# Patient Record
Sex: Female | Born: 1990 | Race: Black or African American | Hispanic: No | Marital: Single
Health system: Southern US, Community
[De-identification: ages and names within clinical notes are randomized; demographics above are authoritative.]

---

## 2012-06-09 ENCOUNTER — Other Ambulatory Visit (HOSPITAL_COMMUNITY): Payer: Self-pay | Admitting: Obstetrics and Gynecology

## 2012-06-09 DIAGNOSIS — IMO0002 Reserved for concepts with insufficient information to code with codable children: Secondary | ICD-10-CM

## 2012-06-09 DIAGNOSIS — O4290 Premature rupture of membranes, unspecified as to length of time between rupture and onset of labor, unspecified weeks of gestation: Secondary | ICD-10-CM

## 2012-06-10 ENCOUNTER — Ambulatory Visit (HOSPITAL_COMMUNITY)
Admission: RE | Admit: 2012-06-10 | Discharge: 2012-06-10 | Disposition: A | Discharge status: Home / Self Care | Payer: Medicaid Other | Source: Ambulatory Visit | Attending: Obstetrics and Gynecology | Admitting: Obstetrics and Gynecology

## 2012-06-10 ENCOUNTER — Other Ambulatory Visit (HOSPITAL_COMMUNITY): Payer: Self-pay | Admitting: Obstetrics and Gynecology

## 2012-06-10 ENCOUNTER — Encounter (HOSPITAL_COMMUNITY): Payer: Self-pay

## 2012-06-10 VITALS — BP 96/62 | HR 70 | Wt 119.0 lb

## 2012-06-10 DIAGNOSIS — O358XX0 Maternal care for other (suspected) fetal abnormality and damage, not applicable or unspecified: Secondary | ICD-10-CM | POA: Insufficient documentation

## 2012-06-10 DIAGNOSIS — O0930 Supervision of pregnancy with insufficient antenatal care, unspecified trimester: Secondary | ICD-10-CM | POA: Insufficient documentation

## 2012-06-10 DIAGNOSIS — O4100X0 Oligohydramnios, unspecified trimester, not applicable or unspecified: Secondary | ICD-10-CM | POA: Insufficient documentation

## 2012-06-10 DIAGNOSIS — Z363 Encounter for antenatal screening for malformations: Secondary | ICD-10-CM | POA: Insufficient documentation

## 2012-06-10 DIAGNOSIS — IMO0002 Reserved for concepts with insufficient information to code with codable children: Secondary | ICD-10-CM

## 2012-06-10 DIAGNOSIS — Z1389 Encounter for screening for other disorder: Secondary | ICD-10-CM | POA: Insufficient documentation

## 2012-06-10 NOTE — Progress Notes (Signed)
Patient seen today  for ultrasound.  See full report in [facility].  Alpha Gula, MD  Patient is referred due to oligohydramnios on outside ultrasound.  She is a 21 yo G1P0 currently at 22 1/7 weeks by previous ultrasound.  She reports that she had initially planned a termination of pregnancy in April.  She had Laminaria placed and subsequently changed her mind.  She reports leakage of fluid since the time that the laminaria were removed some some light vaginal bleeding.  She denies fevers/ chills but reports some cramping frequently.    Limited anatomy noted due to oligo/ anhydramnios.  Kidneys, bladder and stomach bubble were clearly seeen.  The etiology of oligohydramnios is most likely PPROM given the patient's history.  Single IUP at 22 1/7 weeks by previous ultrasound Anhydramnios Limited views of the fetal anatomy noted; however stomach, bladder and kidneys clearly seen History and ultrasound findings consistent with PPROM. TVUS (sterile glove) - cervical length of 2.8 cm without funneling or dynamic changes  Recommend sterile speculum exam/ evaluation for ruptured membranes.   Given the patient's history and symptoms, even if sterile speculum exam is negative would be very suspicious for PROM - would manage emperically.  May consider dye studies if work up is completely negative. Patient was encouraged to purchase a thermometer - will check temperatures at least 2x daily. To come to hosptial for fevers >101, abdominal pain, increased bleeding or cramping  Had brief discussion about fetal viability - will follow up next week (at [redacted] weeks gestation) - plan admission at that time, NICU consult, broad specturm latency antibioticis.  Based on counseling with NICU, would offer steroids at 23 weeks if patient desires intervention at that time.

## 2012-06-10 NOTE — ED Notes (Signed)
Pt denies any fever, chills or cramping.  States she has been  "bleeding for a while"

## 2012-06-18 ENCOUNTER — Ambulatory Visit (HOSPITAL_COMMUNITY): Payer: Medicaid Other

## 2013-06-05 IMAGING — US US OB TRANSVAGINAL
1 series · 16 of 28 positions shown · non-contrast
Comparison: none

[Series 1: us ob transvaginal · 16 of 63 slices shown]
[im 1/63]
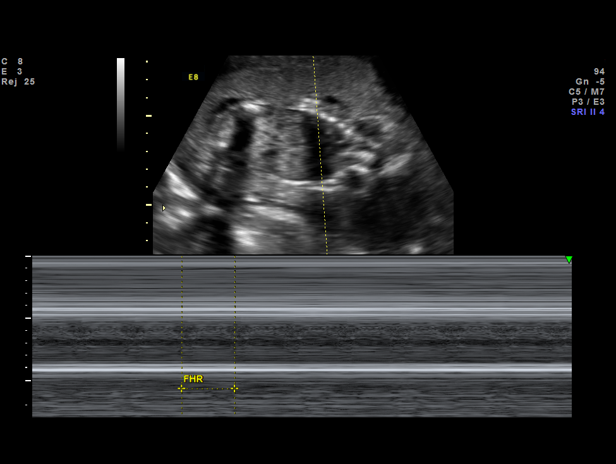
[im 5/63]
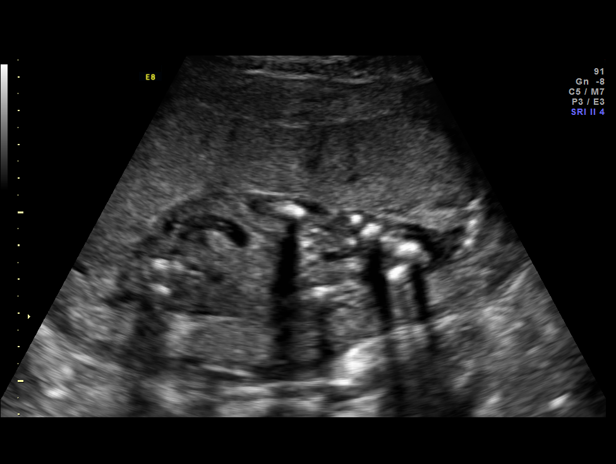
[im 10/63]
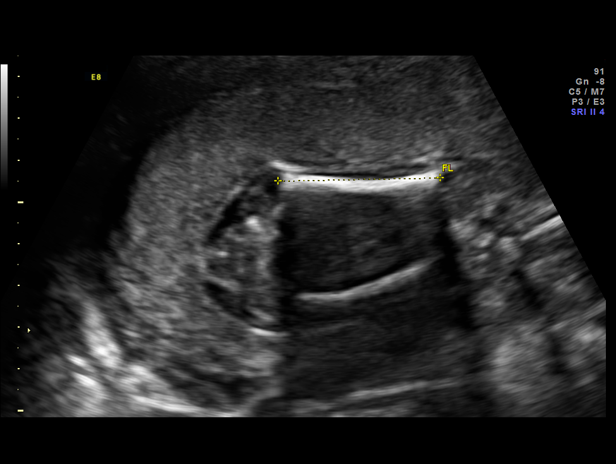
[im 14/63]
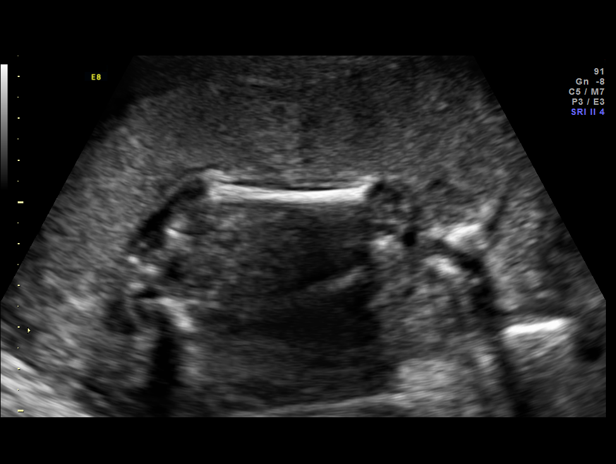
[im 17/63]
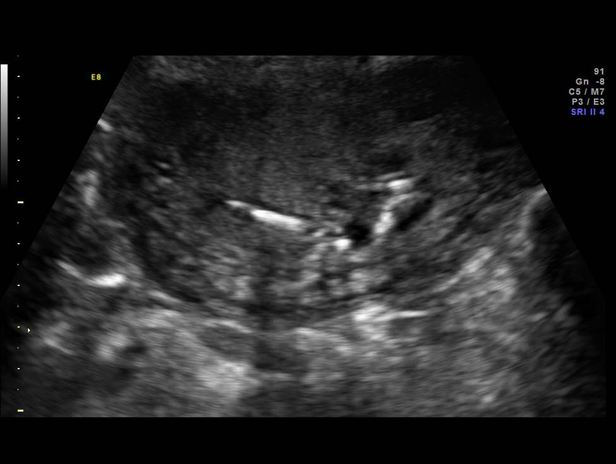
[im 21/63]
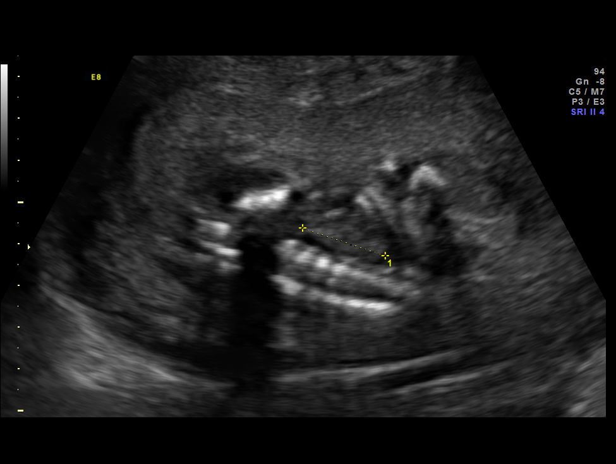
[im 26/63]
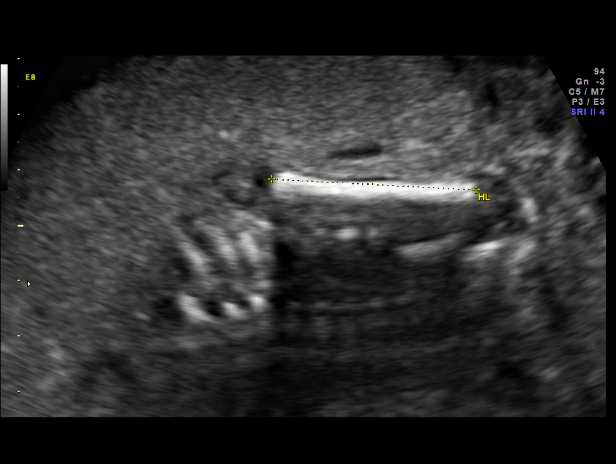
[im 30/63]
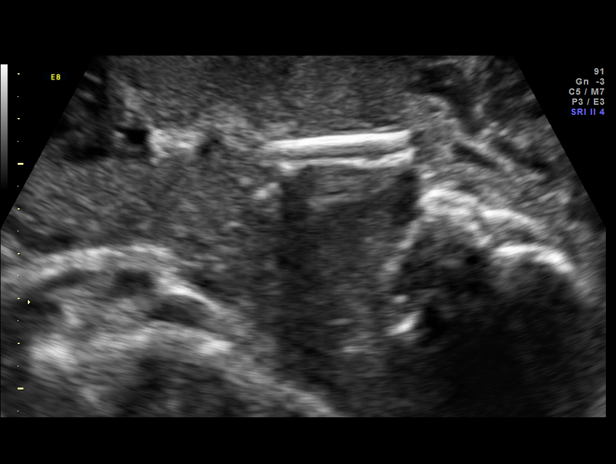
[im 33/63]
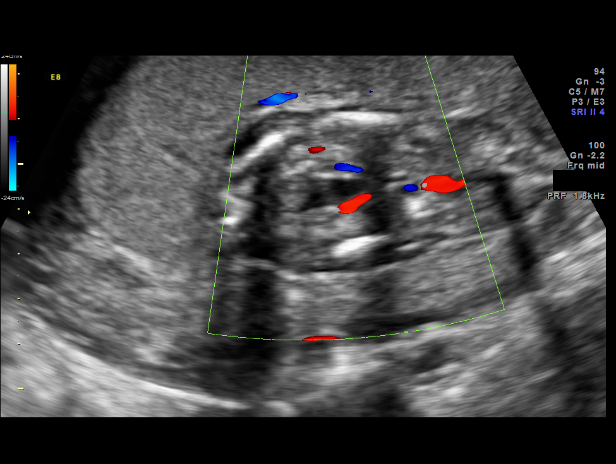
[im 37/63]
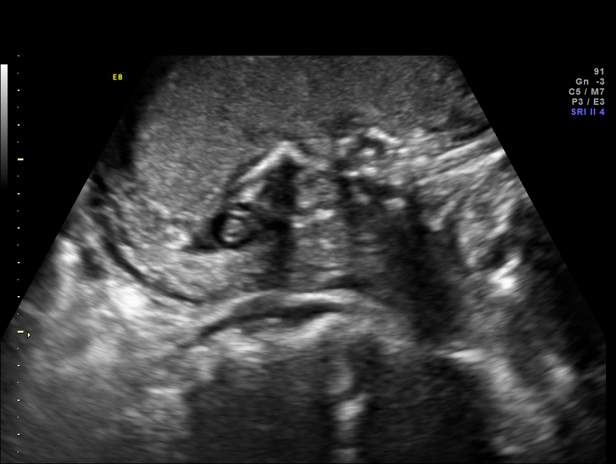
[im 42/63]
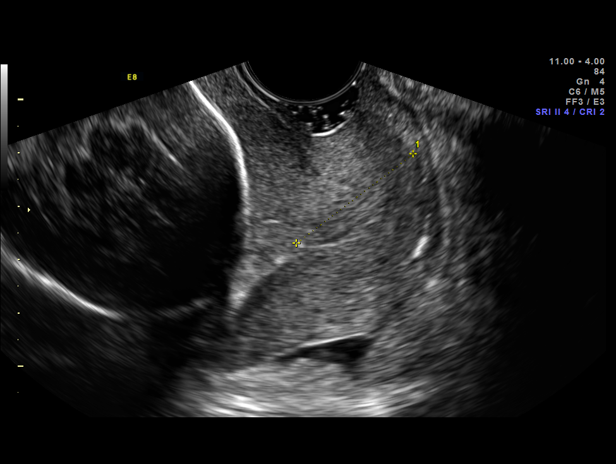
[im 46/63]
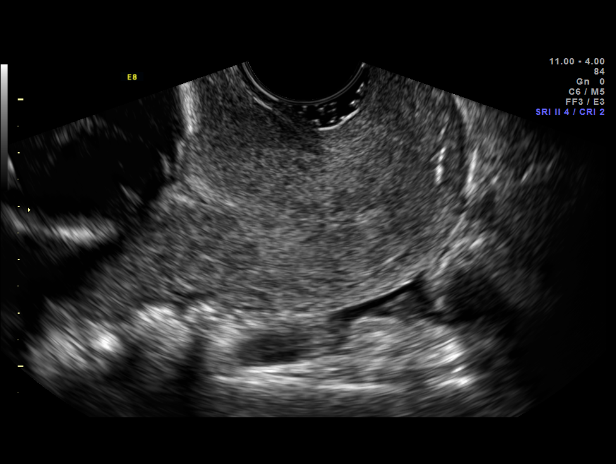
[im 49/63]
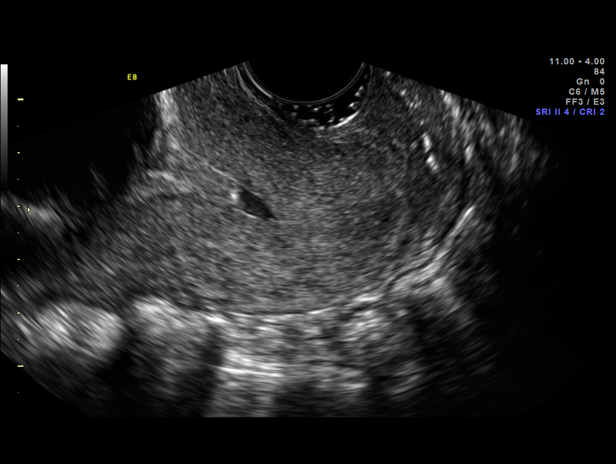
[im 53/63]
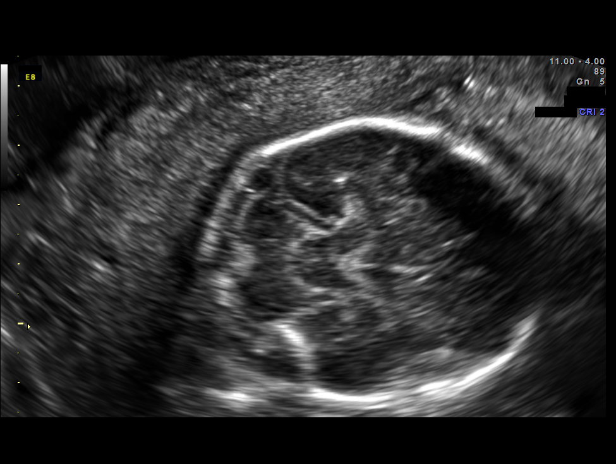
[im 58/63]
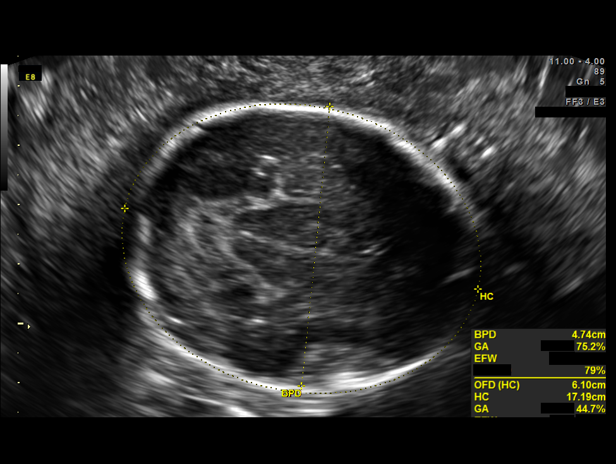
[im 63/63]
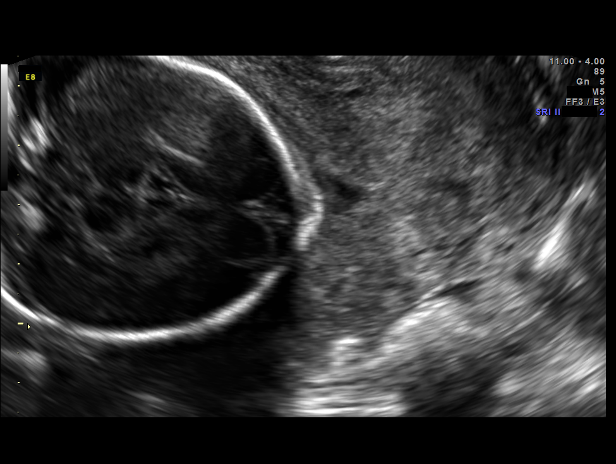

[16 of 28 positions shown; findings below may reference images not displayed]

OBSTETRICS REPORT
                      (Signed Final 06/10/2012 [DATE])

 Order#:         [id number]
                 23_O
Procedures

 US OB DETAIL + 14 WK                                  76811.0
 US OB TRANSVAGINAL                                    76817.0
Indications

 Detailed fetal anatomic survey
 Oligohydramnios / Decreased amniotic fluid volume
 No or Little Prenatal Care
 Vaginal bleeding, unknown etiology
Fetal Evaluation

 Fetal Heart Rate:  161                          bpm
 Cardiac Activity:  Observed
 Presentation:      Cephalic
 Placenta:          Anterior, above cervical os

 Amniotic Fluid
 AFI FV:      Anhydramnios
Biometry

 BPD:     47.6  mm     G. Age:  20w 3d                CI:         77.9   70 - 86
 OFD:     61.1  mm                                    FL/HC:      21.9   18.4 -

 HC:     173.9  mm     G. Age:  19w 6d      < 3  %    HC/AC:      1.08   1.06 -

 AC:     160.5  mm     G. Age:  21w 1d       16  %    FL/BPD:     80.0   71 - 87
 FL:      38.1  mm     G. Age:  22w 2d       42  %    FL/AC:      23.7   20 - 24
 HUM:     36.6  mm     G. Age:  22w 5d       63  %

 Est. FW:     422  gm    0 lb 15 oz      27  %
Gestational Age

 LMP:           19w 5d        Date:  01/24/12                 EDD:   10/30/12
 U/S Today:     21w 0d                                        EDD:   10/21/12
 Best:          22w 1d     Det. By:  Previous Ultrasound      EDD:   10/13/12
Anatomy
 Cranium:           Appears normal      Aortic Arch:       Not well
                                                           visualized
 Fetal Cavum:       Appears normal      Ductal Arch:       Not well
                                                           visualized
 Ventricles:        Appears normal      Diaphragm:         Not well
                                                           visualized
 Choroid Plexus:    Appears normal      Stomach:           Appears
                                                           normal, left
                                                           sided
 Cerebellum:        Appears normal      Abdomen:           Appears normal
 Posterior Fossa:   Appears normal      Abdominal Wall:    Not well
                                                           visualized
 Nuchal Fold:       Not applicable      Cord Vessels:      Appears normal
                    (>20 wks GA)                           (3 vessel cord)
 Face:              Not well            Kidneys:           Appear normal
                    visualized
 Heart:             Not well            Bladder:           Appears normal
                    visualized
 RVOT:              Not well            Spine:             Not well
                    visualized                             visualized
 LVOT:              Not well            Limbs:             Four extremities
                    visualized                             seen
Cervix Uterus Adnexa

 Cervical Length:    2.8      cm

 Cervix:       Normal appearance by transvaginal scan
 Left Ovary:    Not visualized. No adnexal mass visualized.
 Right Ovary:   Not visualized. No adnexal mass visualized.
Comments

 Patient is referred due to oligohydramnios on outside
 ultrasound.  She is a 21 yo G1P0 currently at 22 [DATE] weeks
 by previous ultrasound.  She reports that she had initially
 planned a termination of pregnancy in [place].  She had
 Laminaria placed and subsequently changed her mind.  She
 reports leakage of fluid since the time that the laminaria were
 removed some some light vaginal bleeding.  She denies
 fevers/ chills but reports some cramping frequently.

 Limited anatomy noted due to oligo/ anhydramnios.  Kidneys,
 bladder and stomach bubble were clearly seeen.  The
 etiology of oligohydramnios is most likely PPROM given the
 patient's history.
Impression

 Single IUP at 22 [DATE] weeks by previous ultrasound
 Anhydramnios
 Limited views of the fetal anatomy noted; however stomach,
 bladder and kidneys clearly seen
 History and ultrasound findings consistent with PPROM.
 TVUS (sterile glove) - cervical length of 2.8 cm without
 funneling or dynamic changes
Recommendations

 Recommend sterile speculum exam/ evaluation for ruptured
 membranes.
 Given the patient's history and symptoms, even if sterile
 speculum exam is negative would be very suspicious for
 PROM - would manage emperically.  May consider dye
 studies if work up is completely negative.
 Patient was encouraged to purchase a thermometer - will
 check temperatures at least 2x daily. To come to hosptial for
 fevers >101, abdominal pain, increased bleeding or cramping

 Had brief discussion about fetal viability - will follow up next
 week (at 23 weeks gestation) - plan admission at that time,
 NICU consult, broad specturm latency antibioticis.  Based on
 counseling with NICU, would offer steroids at 23 weeks if
 patient desires intervention at that time.

## 2014-10-10 ENCOUNTER — Encounter (HOSPITAL_COMMUNITY): Payer: Self-pay

## 2022-04-14 ENCOUNTER — Emergency Department (HOSPITAL_COMMUNITY)
Admission: EM | Admit: 2022-04-14 | Discharge: 2022-04-14 | Disposition: A | Discharge status: Home / Self Care | Payer: Medicaid Other | Attending: Emergency Medicine | Admitting: Emergency Medicine

## 2022-04-14 ENCOUNTER — Encounter (HOSPITAL_COMMUNITY): Payer: Self-pay

## 2022-04-14 ENCOUNTER — Emergency Department (HOSPITAL_COMMUNITY): Payer: Medicaid Other

## 2022-04-14 ENCOUNTER — Other Ambulatory Visit: Payer: Self-pay

## 2022-04-14 DIAGNOSIS — S29001A Unspecified injury of muscle and tendon of front wall of thorax, initial encounter: Secondary | ICD-10-CM | POA: Diagnosis present

## 2022-04-14 DIAGNOSIS — E8721 Acute metabolic acidosis: Secondary | ICD-10-CM | POA: Diagnosis not present

## 2022-04-14 DIAGNOSIS — S20212A Contusion of left front wall of thorax, initial encounter: Secondary | ICD-10-CM | POA: Insufficient documentation

## 2022-04-14 DIAGNOSIS — E872 Acidosis, unspecified: Secondary | ICD-10-CM

## 2022-04-14 DIAGNOSIS — E162 Hypoglycemia, unspecified: Secondary | ICD-10-CM | POA: Diagnosis not present

## 2022-04-14 DIAGNOSIS — E876 Hypokalemia: Secondary | ICD-10-CM | POA: Diagnosis not present

## 2022-04-14 DIAGNOSIS — D72829 Elevated white blood cell count, unspecified: Secondary | ICD-10-CM | POA: Diagnosis not present

## 2022-04-14 LAB — CBC WITH DIFFERENTIAL/PLATELET
Abs Immature Granulocytes: 0.57 10*3/uL — ABNORMAL HIGH (ref 0.00–0.07)
Basophils Absolute: 0.1 10*3/uL (ref 0.0–0.1)
Basophils Relative: 1 %
Eosinophils Absolute: 0 10*3/uL (ref 0.0–0.5)
Eosinophils Relative: 0 %
HCT: 46.4 % — ABNORMAL HIGH (ref 36.0–46.0)
Hemoglobin: 15 g/dL (ref 12.0–15.0)
Immature Granulocytes: 3 %
Lymphocytes Relative: 12 %
Lymphs Abs: 2.5 10*3/uL (ref 0.7–4.0)
MCH: 33.1 pg (ref 26.0–34.0)
MCHC: 32.3 g/dL (ref 30.0–36.0)
MCV: 102.4 fL — ABNORMAL HIGH (ref 80.0–100.0)
Monocytes Absolute: 1.2 10*3/uL — ABNORMAL HIGH (ref 0.1–1.0)
Monocytes Relative: 6 %
Neutro Abs: 16.7 10*3/uL — ABNORMAL HIGH (ref 1.7–7.7)
Neutrophils Relative %: 78 %
Platelets: 388 10*3/uL (ref 150–400)
RBC: 4.53 MIL/uL (ref 3.87–5.11)
RDW: 13.7 % (ref 11.5–15.5)
WBC: 21.1 10*3/uL — ABNORMAL HIGH (ref 4.0–10.5)
nRBC: 0 % (ref 0.0–0.2)

## 2022-04-14 LAB — BASIC METABOLIC PANEL
Anion gap: 10 (ref 5–15)
BUN: 10 mg/dL (ref 6–20)
CO2: 19 mmol/L — ABNORMAL LOW (ref 22–32)
Calcium: 8 mg/dL — ABNORMAL LOW (ref 8.9–10.3)
Chloride: 108 mmol/L (ref 98–111)
Creatinine, Ser: 0.44 mg/dL (ref 0.44–1.00)
GFR, Estimated: >60 mL/min (ref >60)
Glucose, Bld: 149 mg/dL — ABNORMAL HIGH (ref 70–99)
Potassium: 3.6 mmol/L (ref 3.5–5.1)
Sodium: 137 mmol/L (ref 135–145)

## 2022-04-14 LAB — COMPREHENSIVE METABOLIC PANEL
ALT: 15 U/L (ref 0–44)
AST: 25 U/L (ref 15–41)
Albumin: 4.8 g/dL (ref 3.5–5.0)
Alkaline Phosphatase: 76 U/L (ref 38–126)
Anion gap: 19 — ABNORMAL HIGH (ref 5–15)
BUN: 12 mg/dL (ref 6–20)
CO2: 15 mmol/L — ABNORMAL LOW (ref 22–32)
Calcium: 8.8 mg/dL — ABNORMAL LOW (ref 8.9–10.3)
Chloride: 109 mmol/L (ref 98–111)
Creatinine, Ser: 0.64 mg/dL (ref 0.44–1.00)
GFR, Estimated: >60 mL/min (ref >60)
Glucose, Bld: 63 mg/dL — ABNORMAL LOW (ref 70–99)
Potassium: 3.1 mmol/L — ABNORMAL LOW (ref 3.5–5.1)
Sodium: 143 mmol/L (ref 135–145)
Total Bilirubin: 0.4 mg/dL (ref 0.3–1.2)
Total Protein: 8.4 g/dL — ABNORMAL HIGH (ref 6.5–8.1)

## 2022-04-14 LAB — I-STAT BETA HCG BLOOD, ED (MC, WL, AP ONLY): I-stat hCG, quantitative: <5 m[IU]/mL (ref <5)

## 2022-04-14 LAB — CBG MONITORING, ED: Glucose-Capillary: 108 mg/dL — ABNORMAL HIGH (ref 70–99)

## 2022-04-14 MED ORDER — KETOROLAC TROMETHAMINE 15 MG/ML IJ SOLN
7.5000 mg | Freq: Once | INTRAMUSCULAR | Status: AC
Start: 2022-04-14 — End: 2022-04-14
  Administered 2022-04-14: 7.5 mg via INTRAVENOUS
  Filled 2022-04-14: qty 1

## 2022-04-14 MED ORDER — SODIUM CHLORIDE 0.9 % IV BOLUS
1000.0000 mL | Freq: Once | INTRAVENOUS | Status: AC
  Administered 2022-04-14: 1000 mL via INTRAVENOUS

## 2022-04-14 MED ORDER — LIDOCAINE 5 % EX PTCH
1.0000 | MEDICATED_PATCH | CUTANEOUS | Status: DC
  Administered 2022-04-14: 1 via TRANSDERMAL
  Filled 2022-04-14: qty 1

## 2022-04-14 MED ORDER — POTASSIUM CHLORIDE CRYS ER 20 MEQ PO TBCR
40.0000 meq | EXTENDED_RELEASE_TABLET | Freq: Once | ORAL | Status: AC
Start: 2022-04-14 — End: 2022-04-14
  Administered 2022-04-14: 40 meq via ORAL

## 2022-04-14 MED ORDER — HALOPERIDOL LACTATE 5 MG/ML IJ SOLN
2.0000 mg | Freq: Once | INTRAMUSCULAR | Status: AC
  Administered 2022-04-14: 2 mg via INTRAVENOUS
  Filled 2022-04-14: qty 1
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
## 2022-04-14 NOTE — Discharge Instructions (Signed)
Take Motrin and Tylenol as needed as directed for rib pain. ?Follow up with your doctor.  ?
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
## 2023-04-09 IMAGING — CR DG RIBS W/ CHEST 3+V*L*
3 series · 3 of 3 positions shown · non-contrast
Comparison: None Available.

CLINICAL DATA: Left ribcage trauma.

EXAM:
LEFT RIBS AND CHEST - 3+ VIEW

[w chest pa]
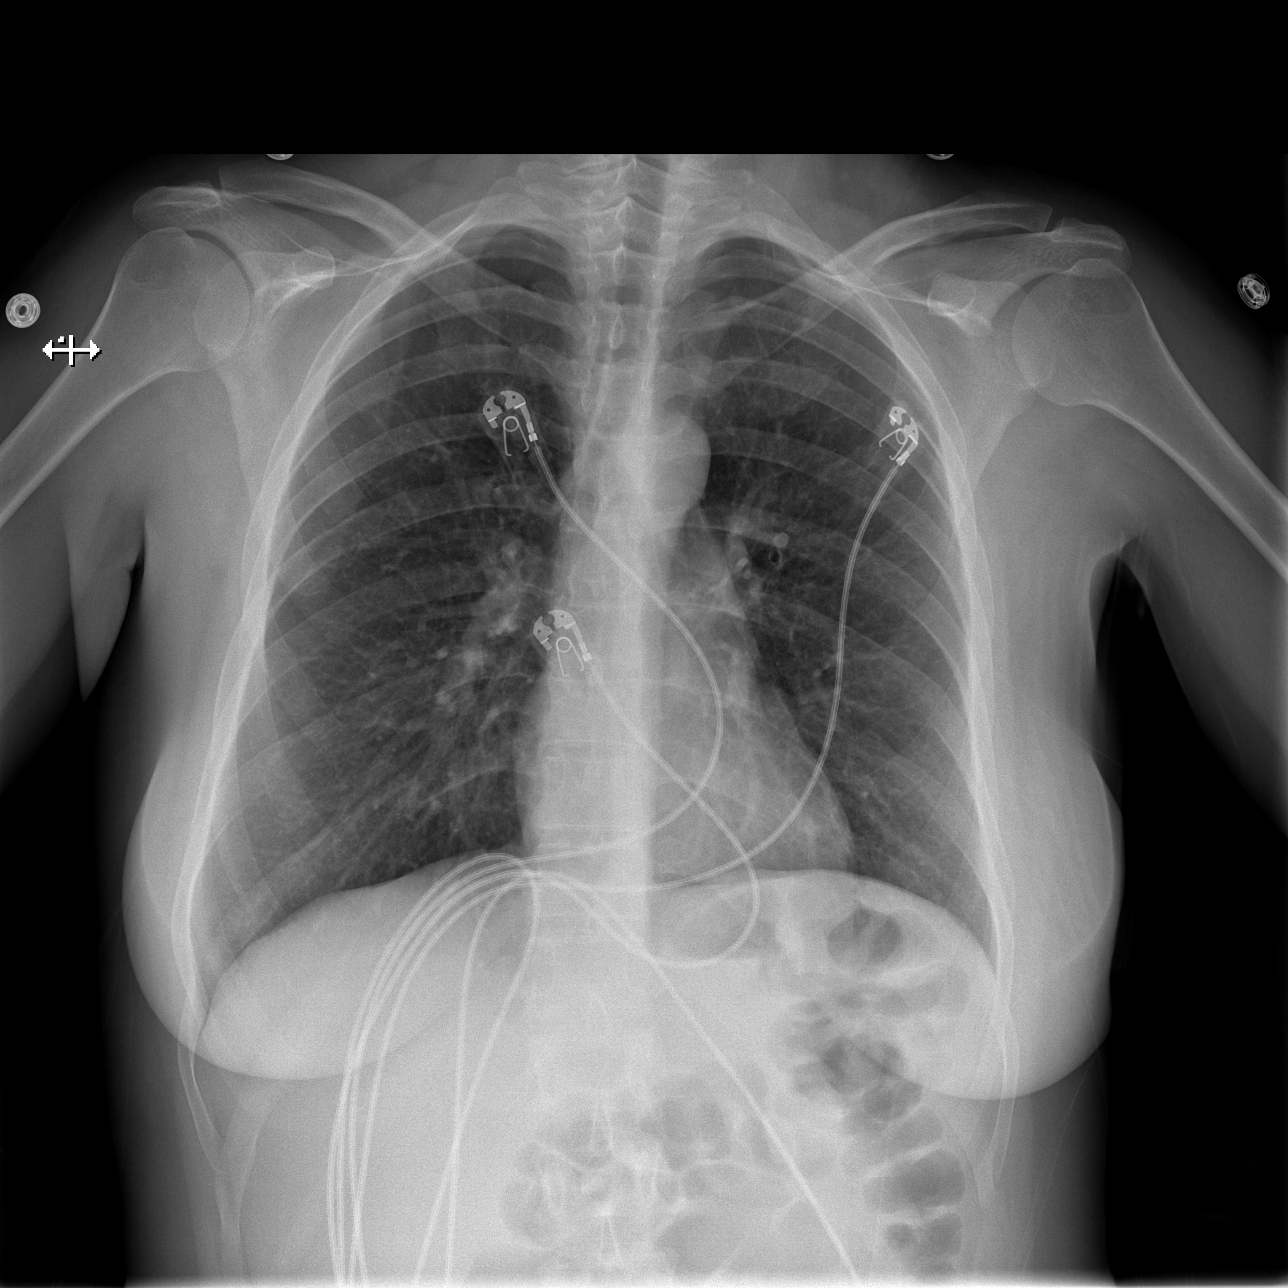

[w ribs ap lower left]
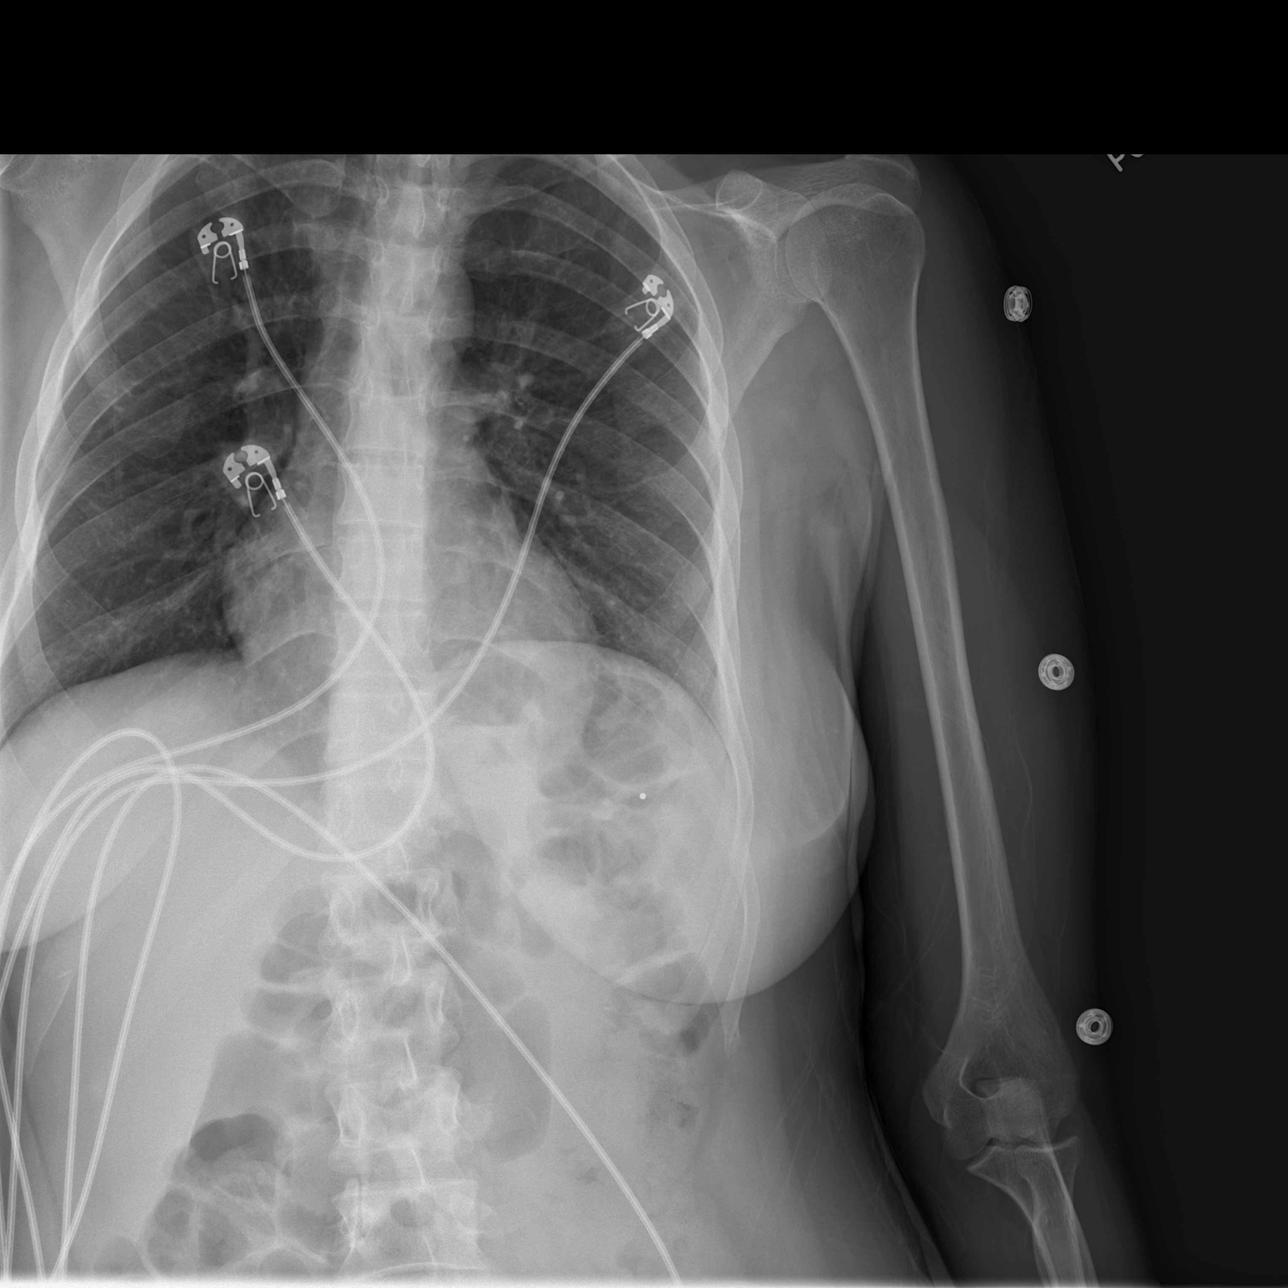

[w ribs obl left]
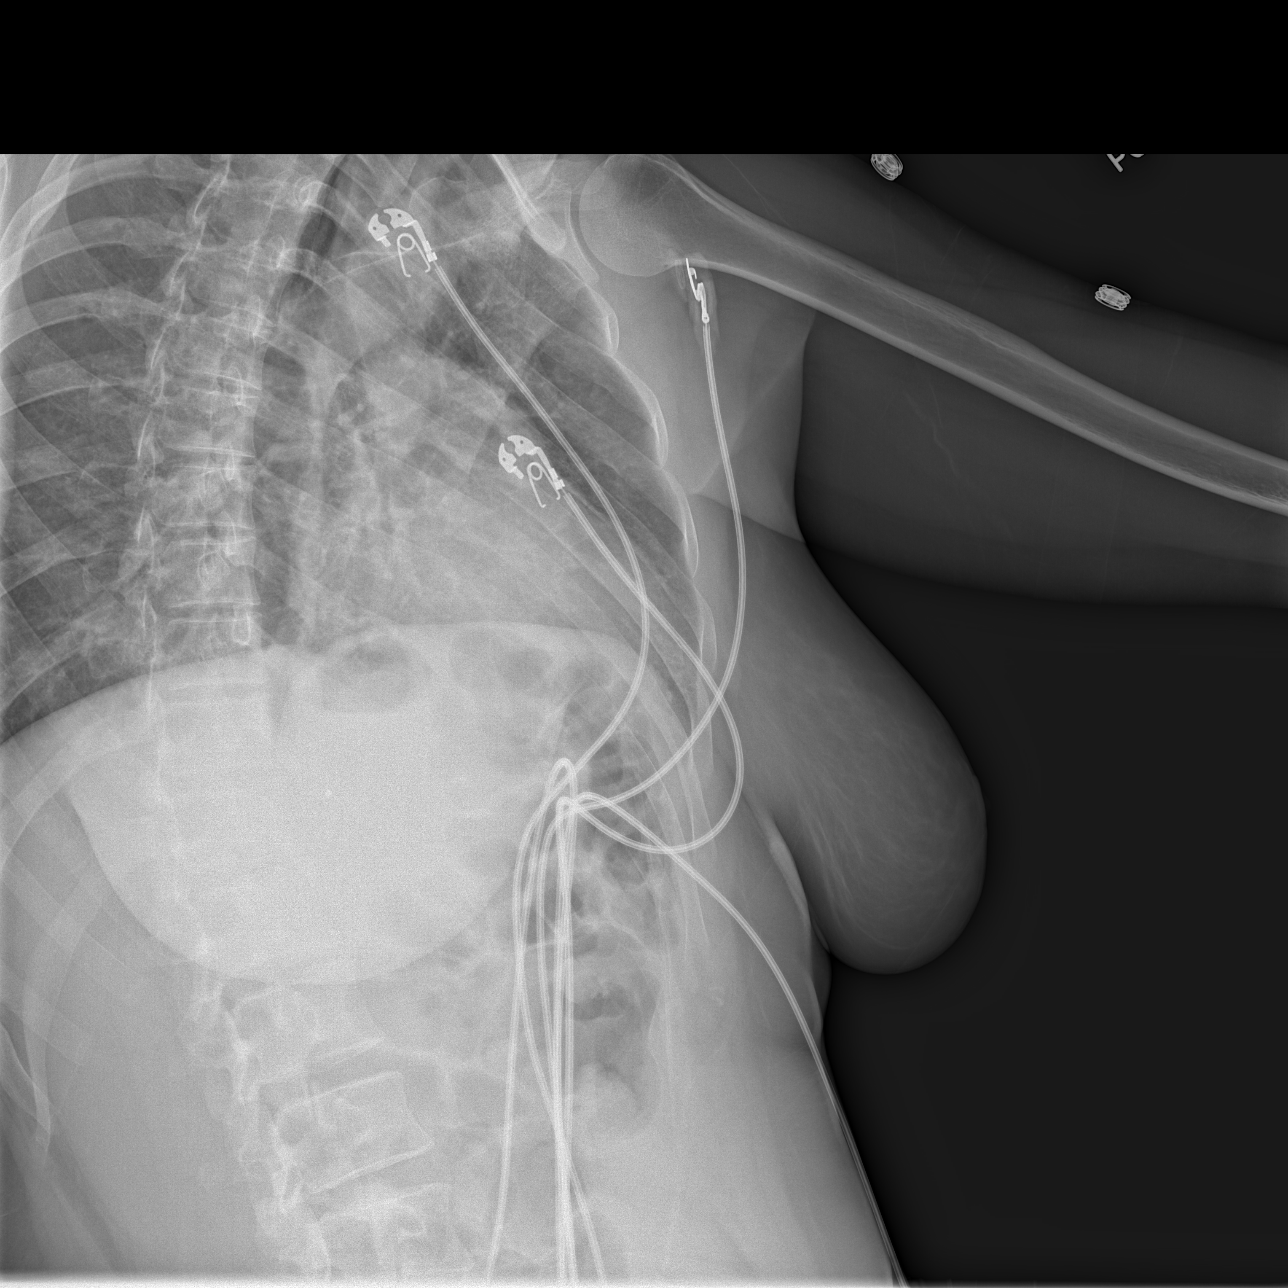

[3 of 3 positions shown; findings below may reference images not displayed]

FINDINGS: No displaced fracture or other bone lesions are seen involving the
ribs. There is no evidence of pneumothorax or pleural effusion. Both
lungs are clear. Heart size and mediastinal contours are within
normal limits.
IMPRESSION: No visible displaced rib fracture or other acute chest findings.
